# Patient Record
Sex: Female | Born: 1935 | Race: White | Hispanic: No | Marital: Married
Health system: Northeastern US, Academic
[De-identification: ages and names within clinical notes are randomized; demographics above are authoritative.]

## PROBLEM LIST (undated history)

## (undated) ENCOUNTER — Encounter

---

## 2017-05-05 LAB — LIPID PROFILE (EXT)
Cholesterol (EXT): 157 mg/dL (ref 0–199)
HDL Cholesterol (EXT): 53.6 mg/dL (ref >=50.0)
LDL Cholesterol, CALC (EXT): 83 mg/dL (ref 0–100)
Risk Factor (EXT): 2.9 (ref 0.0–4.4)
Triglycerides (EXT): 100 mg/dL (ref 10–200)

## 2017-05-05 LAB — UNMAPPED LAB RESULTS: Creatinine (EXT): 0.73 mg/dL (ref 0.50–1.30)

## 2019-02-23 LAB — LIPID PROFILE (EXT)
Cholesterol (EXT): 128 mg/dL (ref 0–199)
HDL Cholesterol (EXT): 46.4 mg/dL — ABNORMAL LOW (ref >=50.0)
LDL Cholesterol, CALC (EXT): 62 mg/dL (ref 0–100)
Risk Factor (EXT): 2.8 (ref 0.0–4.4)
Triglycerides (EXT): 99 mg/dL (ref 10–200)

## 2020-04-03 LAB — LIPID PROFILE (EXT)
Cholesterol (EXT): 172 mg/dL (ref <200)
HDL Cholesterol (EXT): 49.2 mg/dL — ABNORMAL LOW (ref >=60.0)
LDL Cholesterol, CALC (EXT): 77 mg/dL (ref 0–100)
Risk Factor (EXT): 3.5 (ref 0.0–4.4)
Triglycerides (EXT): 228 mg/dL — ABNORMAL HIGH (ref <150)

## 2021-01-28 LAB — LIPID PROFILE (EXT)
Cholesterol (EXT): 119 mg/dL (ref <200)
HDL Cholesterol (EXT): 32.9 mg/dL — ABNORMAL LOW (ref >=60.0)
LDL Cholesterol, CALC (EXT): 58 mg/dL (ref 0–100)
Risk Factor (EXT): 3.6 (ref 0.0–4.4)
Triglycerides (EXT): 139 mg/dL (ref <150)

## 2021-03-10 ENCOUNTER — Other Ambulatory Visit

## 2021-03-10 ENCOUNTER — Institutional Professional Consult (permissible substitution): Admit: 2021-03-10 | Discharge: 2021-03-10 | Payer: MEDICARE

## 2021-03-10 ENCOUNTER — Encounter

## 2021-03-10 VITALS — BP 132/68 | HR 64 | Ht 62.0 in | Wt 173.4 lb

## 2021-03-10 DIAGNOSIS — R42 Dizziness and giddiness: Secondary | ICD-10-CM

## 2021-03-10 NOTE — Progress Notes (Signed)
 I saw and evaluated the patient, participating in the key portions of the service.  I reviewed the resident?s note.  I agree with the resident?s findings and plan.     Val Riles, MD

## 2021-03-10 NOTE — Progress Notes (Signed)
 [facility] - Neurology  [address]  [place] [address]  [phone]    Brittany Morales is a 87 y.o.-year-old female with a history of HTN, HLD, -ve orthostatic test in previous who presented for the evaluation of disequilibrium issues.    PMSx:  Previous surgery: Decompressive laminectomy in past (first one was 2001)  Has had two fusion operations in past (last one was the L34 fusion in 2010)    Reports since beginning of March she has had some equilibrium problems, has fallen once, does use a cane occasionally. She does have that imbalance all the time and some days it is better than the others. PT does not think it is vestibular related. W/in the last month she reports having some jitters and feeling of it internally as if her nerve endings are jumping. Sometimes feels her hands and legs moving and rarer can see them moving. Usually it feels as if there is something inside her. All of her testing was done in [place], has had MRI of brain in March that came out negative, then also has had ENT test which they did not find anything abnormal and it was thought that it is inner ear problem. She describes disequilibrium as lightheadedness, reports that when she is in an open space she gets difficulty walking and becomes wobbly. Triggers for dizziness are getting up from resting position mainly, she mainly valsalva does not cause the dizziness but blowing her nose does. It recovers right away. She does report wobbliness during walking, she walks slower than before now. She feels very comfortable in the pool. Usually she feels lightheaded constantly. Reports visual decrease due to macular degeneration. No hallucinations.  Reports minimal memory issues but overall her husband and she herself do not complain, she is not as sharp as she used to be, mainly w/ names.   Does not recall shuffling gait, reports to have some tremors in her arms at rest that is better when she tries to reach to something. She does  not know if she feels in her UE and LE. Does not notice anything her emotional state. She has urge incontinence and does not think it is anything worsened. She does not think that her speech is slowed. She feels that when she goes to sleep she feels that her legs are jittery but it does not affect her sleep. No mask like facies. She has had covid around mid february. Her symptoms started after that.     MR brain:  MR of brain fall 2021 with mild microvascular changes, scattered in white matter. Report of March 2022 brain MR suggests no significant changes.    MR L spine Sept 2021 shows L2-3 post interbody fusion with mild bilateral facet arthropathy and with some right neuroforaminal stenosis. Mild spinal canal stenosis; L3/4 PLIF and pedicles screws in L4 and L4 without overt lateral recess, foraminal or canal stenosis. At L45 there is a small disc bulge with Mild lateral recess, foraminal or canal stenosis; less prominent findings at L5S1 than at L45.    They live half of the year in [place], half of it in [facility]    Subjective      Past Medical History:  No past medical history on file.    No past surgical history on file.    Allergies:  Patient has no allergy information on record.    Current Meds:  Current Outpatient Medications   Medication Sig Dispense Refill   ? atenolol (Tenormin) 25 mg  tablet Take 25 mg by mouth in the morning.     ? cefuroxime (Ceftin) 500 mg tablet Take 500 mg by mouth in the morning and at bedtime.     ? fluticasone (Flonase) 50 mcg/actuation nasal spray 1 spray in the morning.     ? hydroCHLOROthiazide (Microzide) 12.5 mg capsule Take 12.5 mg by mouth in the morning.     ? valsartan (Diovan) 80 mg tablet Take 160 mg by mouth in the morning.       No current facility-administered medications for this visit.       Social History:  Social History     Socioeconomic History   ? Marital status: Married     Spouse name: Not on file   ? Number of children: Not on file   ? Years of education: Not on  file   ? Highest education level: Not on file   Occupational History   ? Not on file   Tobacco Use   ? Smoking status: Not on file   ? Smokeless tobacco: Not on file   Substance and Sexual Activity   ? Alcohol use: Not on file   ? Drug use: Not on file   ? Sexual activity: Not on file   Other Topics Concern   ? Not on file   Social History Narrative   ? Not on file     Social Determinants of Health     Financial Resource Strain: Not on file   Food Insecurity: Not on file   Transportation Needs: Not on file   Physical Activity: Not on file   Stress: Not on file   Social Connections: Not on file   Intimate Partner Violence: Not on file   Housing Stability: Not on file       Family History:  No family history on file.      Objective     Last Recorded Vitals  Visit Vitals  BP 132/68   Pulse 64   Ht 1.575 m   Wt 78.7 kg   BMI 31.72 kg/m?   BSA 1.86 m?     Neurologic  Mental Status: Alert, recalls a coherent and detailed history. Speech is fluent with full sentences. Follows midline and appendicular commands. No dysarthria. 3/3, attention intact.  Cranial nerves:   III, IV, VI: Pupils briskly reactive to light and symmetric, 3->62mm bilaterally. EOMI, intact convergence. Smooth saccades with no pathologic nystagmus.   V: Sensation intact in V1 to V3 face.   VII: No facial droop, symmetric rise of eye brows, symmetric face at rest and with smile.   VIII: Hearing is normal on conversation.   IX, X: Uvula midline; palate elevates symmetrically.   XI: Shoulder shrug intact.   XII: Able to point tongue straight and side to side. No fasciculations observed.   Motor: Normal tone.   R/L:  Deltoid 5/5, Biceps 5/5, Triceps 5/5, Wrist extension 5/5, Finger extension 5/5, Finger flexion 5/5,   Hip flexion 5/5, Knee flexion 5/5, Knee extension 5/5, Ankle dorsiflexion 5/5, Ankle Plantarflexion 5/5,   Reflexes: Biceps 2+/2+, Triceps 2+/2+, Brachioradialis 2+/2+. Patellar unable to get and achilles 0.  Sensory: Light touch normal in upper  and lower extremities. Romberg +ve  Coordination: No dysmetria on finger-to-nose. No resting tremor but slowed rapid movements worse on the left compared to the right  Gait/Stance: Gait is unsteady, scooped front shoulders, decreased arm sway on left more than right, en bloc turning    Assessment and Plans:  Assessment/Plan    36 F coming in for possible bradykinesia that is asymmetric worse on left than on the right, en bloc turning w/ concern for possible not fully specified at the moment Parkinsonian symptoms. Recommendation would be to start w/ dat scan before initiating any medications. MRI no images on file but would request them from [facility].    Recommendations:  dat scan to eval for possible parkinsonism  Can consider compression stockings for possible mild orthostatic hypotension/autonomic symptoms  cw b12 replacement as ordered by primary care  RTC in 6 weeks after scan is done  Encourage oral hydration  cw PT as before    Dorena Dew, MD   Department of Neurology   [facility]

## 2021-03-11 ENCOUNTER — Encounter (HOSPITAL_BASED_OUTPATIENT_CLINIC_OR_DEPARTMENT_OTHER)

## 2021-04-04 ENCOUNTER — Other Ambulatory Visit

## 2021-04-05 ENCOUNTER — Other Ambulatory Visit

## 2021-04-06 ENCOUNTER — Other Ambulatory Visit

## 2021-04-06 ENCOUNTER — Inpatient Hospital Stay: Admit: 2021-04-06 | Payer: MEDICARE

## 2021-04-06 DIAGNOSIS — R42 Dizziness and giddiness: Secondary | ICD-10-CM

## 2021-04-06 MED ORDER — potassium iodide (Expectorant) (SSKI) solution 450 mg
1 | Freq: Once | ORAL | Status: AC
Start: 2021-04-06 — End: 2021-04-06
  Administered 2021-04-06: 14:00:00 450 mg via ORAL

## 2021-04-06 MED ORDER — ioflupane (I-123) (DaTscan) radio-isotope injection 5.1 millicurie
5 | Freq: Once | INTRAVENOUS | Status: AC
Start: 2021-04-06 — End: 2021-04-06
  Administered 2021-04-06: 16:00:00 5.1 via INTRAVENOUS

## 2021-04-19 ENCOUNTER — Other Ambulatory Visit

## 2021-04-20 ENCOUNTER — Ambulatory Visit: Admit: 2021-04-20 | Discharge: 2021-04-20 | Payer: MEDICARE

## 2021-04-20 VITALS — BP 159/65 | HR 56 | Ht 62.01 in | Wt 176.4 lb

## 2021-04-20 DIAGNOSIS — R251 Tremor, unspecified: Secondary | ICD-10-CM

## 2021-04-20 MED ORDER — carbidopa-levodopa (Sinemet) 25-100 mg tablet
25-100 | ORAL_TABLET | Freq: Three times a day (TID) | ORAL | 5 refills | 17.00000 days | Status: AC
Start: 2021-04-20 — End: 2021-11-01

## 2021-04-20 MED ORDER — carbidopa-levodopa (Sinemet) 25-100 mg tablet
25-100 | ORAL_TABLET | Freq: Three times a day (TID) | ORAL | 0 refills | 17.00000 days | Status: AC
Start: 2021-04-20 — End: 2021-05-04

## 2021-04-20 NOTE — Progress Notes (Signed)
 [facility] - Neurology  [address]  [place] [address]  [phone]      Brittany Morales is a 87 y.o.-year-old female with a history of HTN, HLD, -ve orthostatic test in previous who presented for the evaluation of disequilibrium issues.     PMSx:  Previous surgery: Decompressive laminectomy in past (first one was 2001)  Has had two fusion operations in past (last one was the L34 fusion in 2010)     Reports since beginning of March she has had some equilibrium problems, has fallen once, does use a cane occasionally. She does have that imbalance all the time and some days it is better than the others. PT does not think it is vestibular related. W/in the last month she reports having some jitters and feeling of it internally as if her nerve endings are jumping. Sometimes feels her hands and legs moving and rarer can see them moving. Usually it feels as if there is something inside her. All of her testing was done in [place], has had MRI of brain in March that came out negative, then also has had ENT test which they did not find anything abnormal and it was thought that it is inner ear problem. She describes disequilibrium as lightheadedness, reports that when she is in an open space she gets difficulty walking and becomes wobbly. Triggers for dizziness are getting up from resting position mainly, she mainly valsalva does not cause the dizziness but blowing her nose does. It recovers right away. She does report wobbliness during walking, she walks slower than before now. She feels very comfortable in the pool. Usually she feels lightheaded constantly. Reports visual decrease due to macular degeneration. No hallucinations.  Reports minimal memory issues but overall her husband and she herself do not complain, she is not as sharp as she used to be, mainly w/ names.   Does not recall shuffling gait, reports to have some tremors in her arms at rest that is better when she tries to reach to something. She  does not know if she feels in her UE and LE. Does not notice anything her emotional state. She has urge incontinence and does not think it is anything worsened. She does not think that her speech is slowed. She feels that when she goes to sleep she feels that her legs are jittery but it does not affect her sleep. No mask like facies. She has had covid around mid february. Her symptoms started after that.      MR brain:  MR of brain fall 2021 with mild microvascular changes, scattered in white matter. Report of March 2022 brain MR suggests no significant changes.    MR L spine Sept 2021 shows L2-3 post interbody fusion with mild bilateral facet arthropathy and with some right neuroforaminal stenosis. Mild spinal canal stenosis; L3/4 PLIF and pedicles screws in L4 and L4 without overt lateral recess, foraminal or canal stenosis. At L45 there is a small disc bulge with Mild lateral recess, foraminal or canal stenosis; less prominent findings at L5S1 than at L45.     They live half of the year in [place], half of it in [facility]    Interval history:  For 2 days this past week has been feeling completely normal. Did not have any jitters, she is encouraged by feeling better. Today she has been feeling very wobbly. Has been feeling sensation of almost ginger ale/feeling of movement going on and she does not see it but feels it.  Overall feels stable.     Subjective      Past Medical History:  No past medical history on file.    No past surgical history on file.    Allergies:  Patient has no allergy information on record.    Current Meds:  Current Outpatient Medications   Medication Sig Dispense Refill    atenolol (Tenormin) 25 mg tablet Take 25 mg by mouth in the morning.      fluticasone (Flonase) 50 mcg/actuation nasal spray 1 spray in the morning.      valsartan (Diovan) 80 mg tablet Take 160 mg by mouth in the morning.      cefuroxime (Ceftin) 500 mg tablet Take 500 mg by mouth in the morning and at bedtime.       hydroCHLOROthiazide (Microzide) 12.5 mg capsule Take 12.5 mg by mouth in the morning.       No current facility-administered medications for this visit.       Social History:  Social History     Socioeconomic History    Marital status: Married     Spouse name: Not on file    Number of children: Not on file    Years of education: Not on file    Highest education level: Not on file   Occupational History    Not on file   Tobacco Use    Smoking status: Not on file    Smokeless tobacco: Not on file   Substance and Sexual Activity    Alcohol use: Not on file    Drug use: Not on file    Sexual activity: Not on file   Other Topics Concern    Not on file   Social History Narrative    Not on file     Social Determinants of Health     Financial Resource Strain: Not on file   Food Insecurity: Not on file   Transportation Needs: Not on file   Physical Activity: Not on file   Stress: Not on file   Social Connections: Not on file   Intimate Partner Violence: Not on file   Housing Stability: Not on file       Family History:  No family history on file.      Objective     Last Recorded Vitals  Visit Vitals  BP (!) 159/65 (BP Location: Left arm, Patient Position: Sitting)   Pulse 56   Ht 1.575 m   Wt 80 kg   BMI 32.26 kg/m   BSA 1.87 m       Mental Status: Alert, recalls a coherent and detailed history. Speech is fluent with full sentences. Follows midline and appendicular commands. No dysarthria. 3/3, attention intact.  Cranial nerves:   III, IV, VI: Pupils briskly reactive to light and symmetric, 3->72mm bilaterally. EOMI, intact convergence. Smooth saccades with no pathologic nystagmus.   V: Sensation intact in V1 to V3 face.   VII: No facial droop, symmetric rise of eye brows, symmetric face at rest and with smile.   VIII: Hearing is normal on conversation.   IX, X: Uvula midline; palate elevates symmetrically.   XI: Shoulder shrug intact.   XII: Able to point tongue straight and side to side. No fasciculations observed.    Motor: Normal tone.   R/L:  Deltoid 5/5, Biceps 5/5, Triceps 5/5, Wrist extension 5/5, Finger extension 5/5, Finger flexion 5/5,   Hip flexion 5/5, Knee flexion 5/5, Knee extension 5/5, Ankle dorsiflexion 5/5, Ankle Plantarflexion 5/5,  Reflexes: Biceps 2+/2+, Triceps 2+/2+, Brachioradialis 2+/2+. No clonus. Patellar unable to get and achilles 0.  Sensory: Light touch and cold touch normal in upper and lower extremities. Romberg +ve  Coordination: No dysmetria on finger-to-nose. No resting tremor but slowed rapid movements worse on the left compared to the right  Gait/Stance: Gait is unsteady, scooped front shoulders, decreased arm sway on left more than right, en bloc turning    Assessment and Plans:   Assessment/Plan     87 year old woman coming in for possible bradykinesia that is asymmetric; worse on left than on the right with en bloc turning. There is concern for possible though not fully specified Parkinsonian symptoms. Likelihood of parkinson on lower side right now, and, to complete her work, will order MRI C spine to evaluate for other causes of gait difficulty such as cervical myelopathy. Will also start patient on antiparkinson med such as sinemet as trial. MRI request from [facility]. DaT scan unremarkable.    Recommendations:  Taper of sinemet half tab for 2 weeks, 25/100, then start on sinemet 1 tab 25/100  MRI c spine to eval for possible cause of dizziness  Compression stockings for possible mild orthostatic hypotension/autonomic symptoms  cw b12 replacement as ordered by primary care  RTC in 6 weeks after MR C spine is done  Encourage oral hydration  cw PT as before  Request brain imaging pcitures from [facility] in [place]    Answers for HPI/ROS submitted by the patient on 04/19/2021  altered mental status: No  clumsiness: Yes  focal sensory loss: No  focal weakness: No  loss of balance: Yes  memory loss: No  near-syncope: No  slurred speech: No  syncope: No  visual change: No  weakness: No  Chronicity:  chronic  Onset: more than 1 month ago  Onset quality: gradually  Progression since onset: unchanged  Focality: no focality noted  abdominal pain: No  auditory change: No  aura: No  back pain: No  bladder incontinence: No  bowel incontinence: No  chest pain: No  confusion: No  diaphoresis: No  dizziness: No  fatigue: Yes  fever: No  headaches: No  light-headedness: Yes  nausea: No  neck pain: No  palpitations: No  shortness of breath: No  vertigo: No  vomiting: No  Treatments tried: medication  Improvement on treatment: no relief    Dorena Dew, MD   Department of Neurology   [facility]

## 2021-04-20 NOTE — Progress Notes (Signed)
 I saw and evaluated the patient, participating in the key portions of the service.  I reviewed the resident's note and I agree with findings and plan.

## 2022-11-28 IMAGING — MR MRI BRAIN WITHOUT CONTRAST
8 of 12 series · 33 of 48 positions shown · IV contrast (gadolinium)
Comparison: None

________________________________________________________________________________________________ 
MRI BRAIN WITHOUT CONTRAST, 11/28/2022 [DATE]: 
CLINICAL INDICATION: Dizziness. Equilibrium and balance issues. Fall [DATE] years 
ago with trauma to head. Prior lumbar fusion.
TECHNIQUE: Multiplanar, multiecho position MR images of the brain were performed 
without intravenous gadolinium enhancement. Patient was scanned on a 3T magnet.

[Series 101: survey · axial · 10.0mm · 0.98mm/px · 1 of 5 slices shown]
[im 1/5]
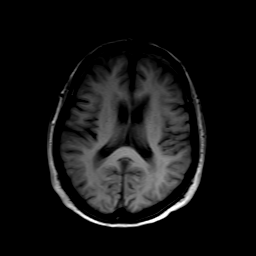

[Series 203: dadc map · axial · 4.0mm · 1.07mm/px · z∈[-99,+45]mm · 3 of 30 slices shown (1 of 2)]
[im 1/30]
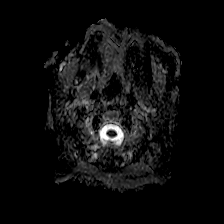
[im 15/30]
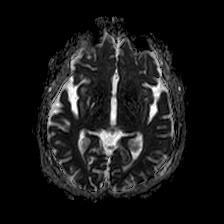
[im 30/30]
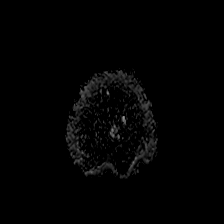

[Series 204: isob (id) · axial · 4.0mm · 1.07mm/px · z∈[-99,+45]mm · 3 of 30 slices shown (1 of 2)]
[im 1/30]
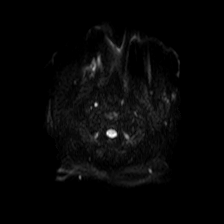
[im 15/30]
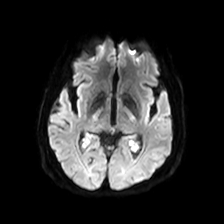
[im 30/30]
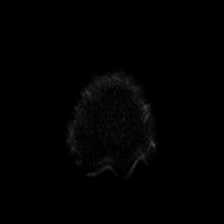

[Series 303: dadc map · coronal · 4.0mm · 0.81mm/px · 3 of 33 slices shown (2 of 2)]
[im 1/33]
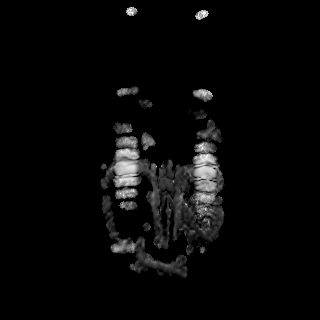
[im 17/33]
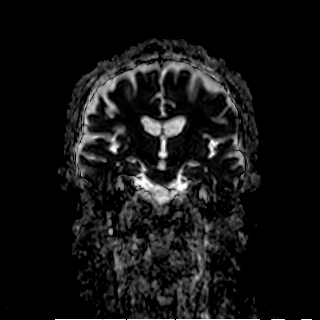
[im 33/33]
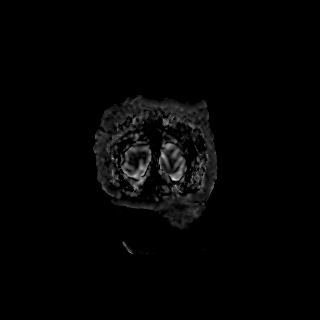

[Series 304: isob (id) · coronal · 4.0mm · 0.81mm/px · 1 of 33 slices shown (2 of 2)]
[im 1/33]
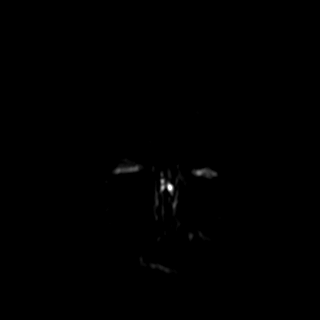

[Series 501: FLAIR fat-sat · axial · 5.0mm · 0.60mm/px · z∈[-114,+39]mm · 3 of 27 slices shown]
[im 1/27]
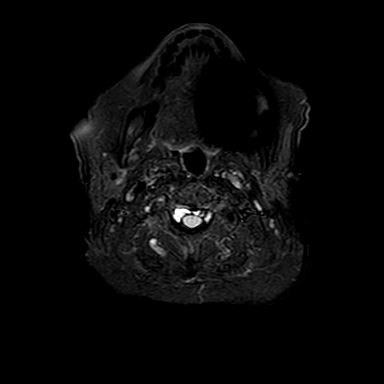
[im 14/27]
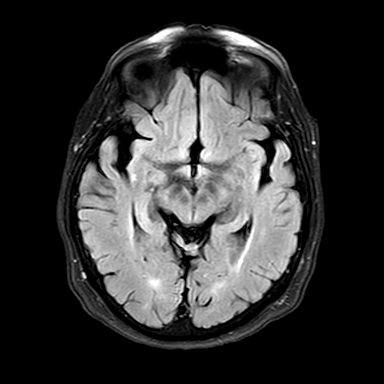
[im 27/27]
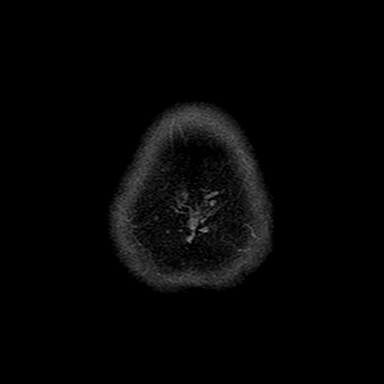

[Series 601: SWI · axial · 3.0mm · 0.53mm/px · z∈[-112,+33]mm · 11 of 100 slices shown (1 of 2)]
[im 1/100]
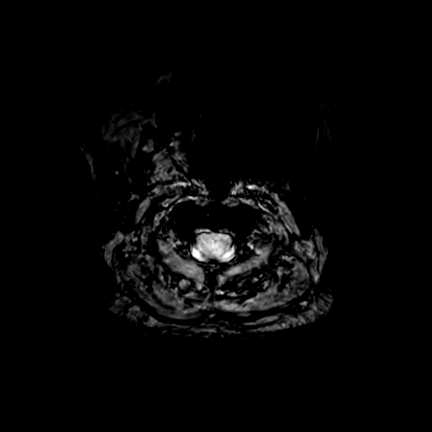
[im 10/100]
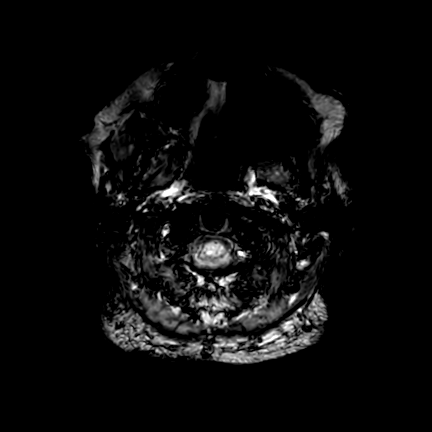
[im 20/100]
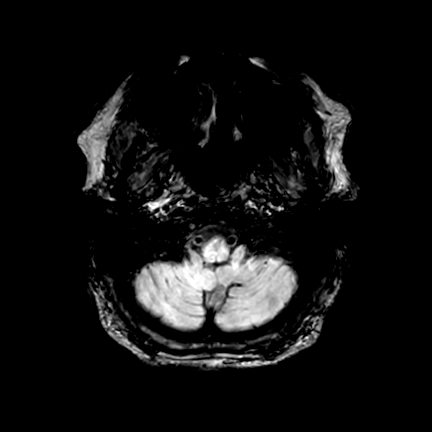
[im 30/100]
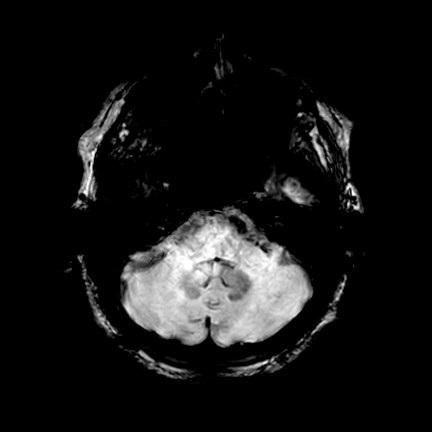
[im 40/100]
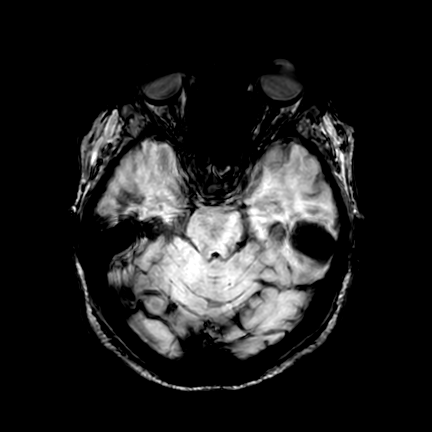
[im 50/100]
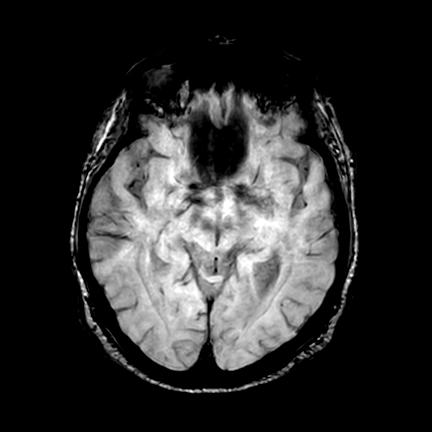
[im 60/100]
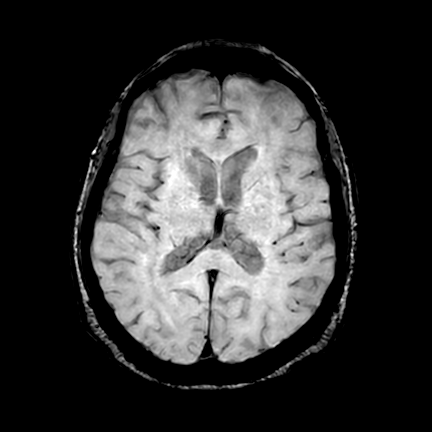
[im 70/100]
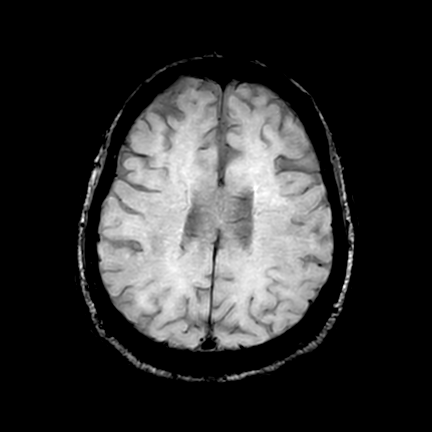
[im 80/100]
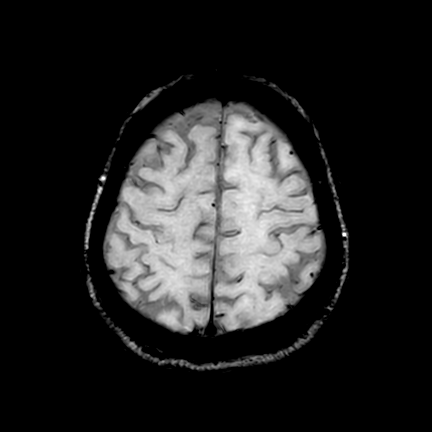
[im 90/100]
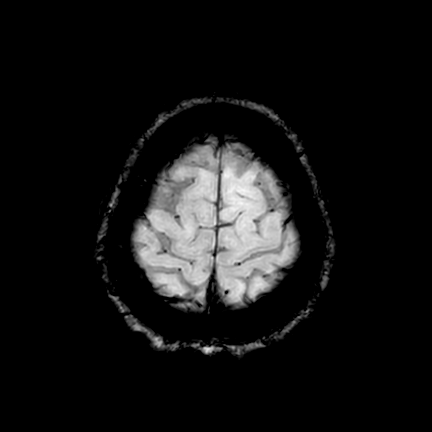
[im 100/100]
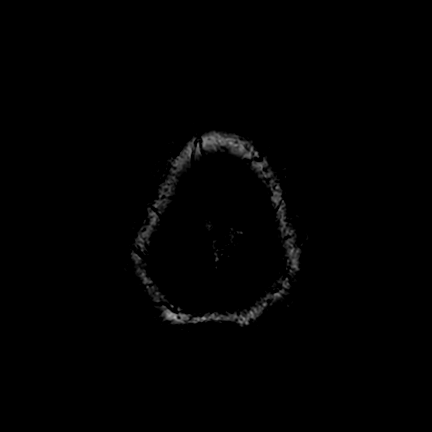

[Series 602: SWI · axial · 10.0mm · 0.53mm/px · z∈[-113,+38]mm · 8 of 78 slices shown (2 of 2)]
[im 1/78]
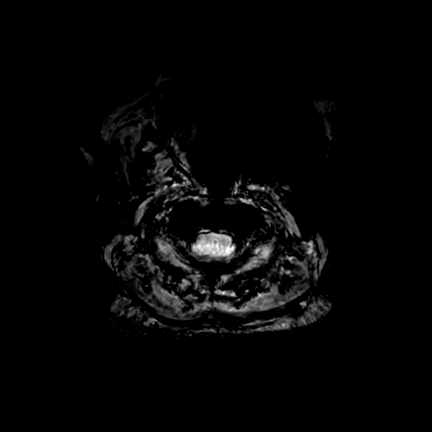
[im 12/78]
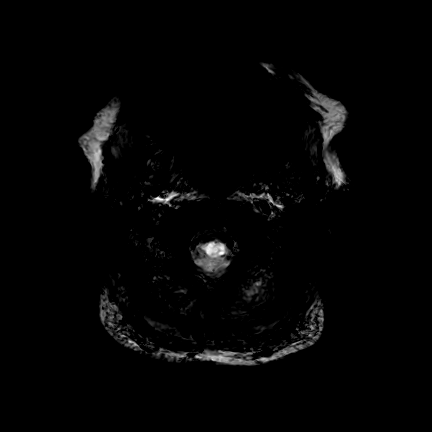
[im 23/78]
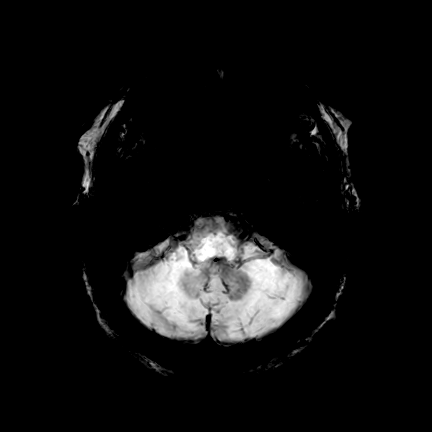
[im 34/78]
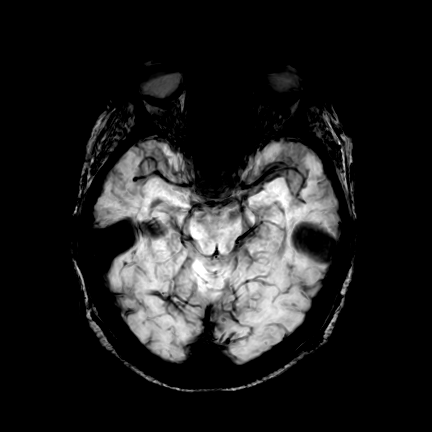
[im 45/78]
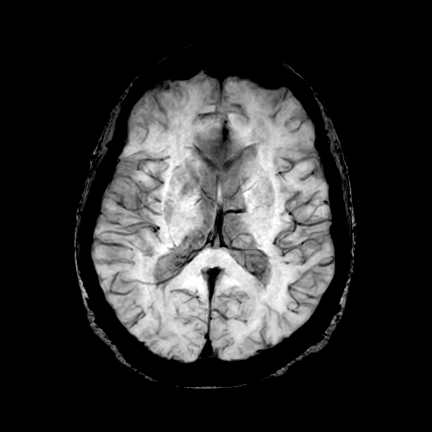
[im 56/78]
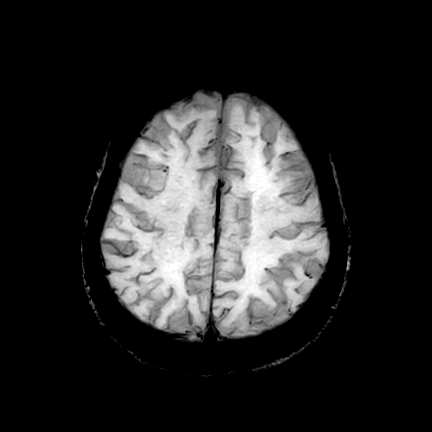
[im 67/78]
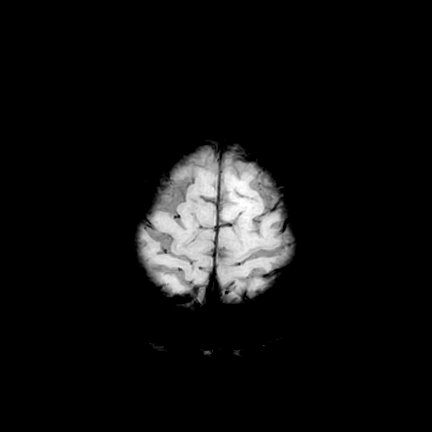
[im 78/78]
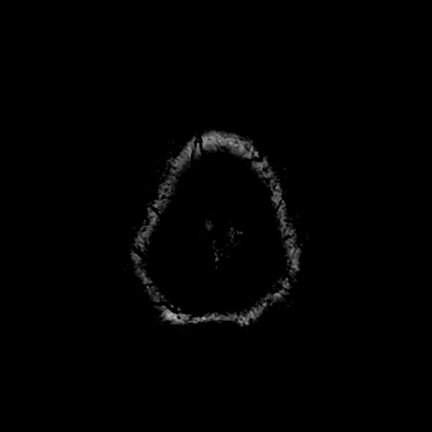

[33 of 48 positions shown; findings below may reference images not displayed]

FINDINGS: -------------------------------------------------------------------------------- 
------------------------- 
INTRACRANIAL: 
Mild degree of nonspecific periventricular, deep white matter, and subcortical 
T2 FLAIR hyperintensity is most likely reflective of chronic small vessel 
vascular change. No mass or abnormal extra-axial fluid collection. Similar 
findings in the subinsular regions bilaterally. Old slitlike subcentimeter right 
subinsular lacunar infarct versus prominent perivascular space. 
No acute ischemia. No abnormal foci of susceptibility artifact in the brain. 
Patency of the intracranial vascular flow voids.  No acute intracranial 
hemorrhage, mass effect, midline shift. No large sellar mass. No hydrocephalus. 
Cerebral volume is age appropriate.  
-------------------------------------------------------------------------------- 
----------------------- 
OTHER: 
ORBITS/SINUSES/T-BONES:  Bilateral pseudophakia.  Mastoid air cells and middle 
ear cavities are grossly clear.  Visualized paranasal sinuses are clear. 
MARROW SIGNAL/SOFT TISSUES: No focal suspect signal abnormality.  Suggestion for 
mild canal stenosis C3-C4 with ventral cord impression. 
-------------------------------------------------------------------------------- 
-------------------
IMPRESSION: No acute intracranial process. Chronic appearing brain parenchymal changes as 
detailed above. No mass or mass effect. 
Possible mild canal stenosis C3-C4 with ventral cord indentation. Consider 
dedicated MRI cervical spine.

## 2023-01-01 IMAGING — MR MRI CERVICAL SPINE WITHOUT CONTRAST
5 of 7 series · 23 of 48 positions shown · IV contrast (gadolinium)
Comparison: Brain MRI November 28, 2022.

________________________________________________________________________________________________ 
MRI CERVICAL SPINE WITHOUT CONTRAST, 01/01/2023 [DATE]: 
CLINICAL INDICATION: Neck pain with limited range of motion. Numbness in the 
hands bilaterally.
TECHNIQUE: Multiplanar, multiecho position MR images of the cervical spine were 
performed without intravenous gadolinium enhancement. Patient was scanned on a 3 
[employer] magnet.

[Series 101: survey · axial · 10.0mm · 0.94mm/px · z∈[-15,+150]mm · 4 of 9 slices shown]
[im 1/9]
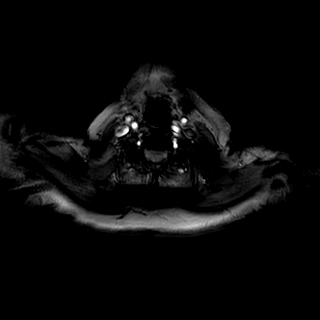
[im 3/9]
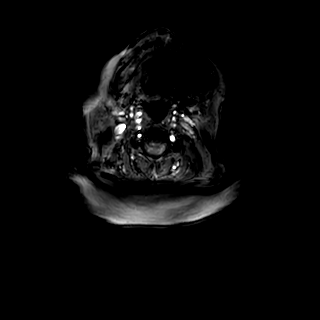
[im 6/9]
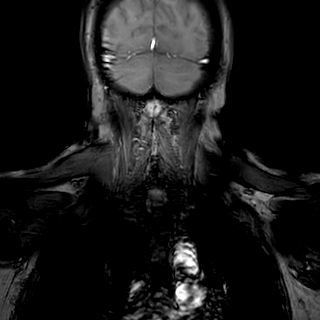
[im 9/9]
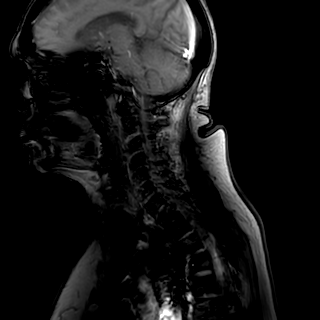

[Series 201: t2w_tse cor · coronal · 5.0mm · 0.49mm/px · 3 of 7 slices shown]
[im 1/7]
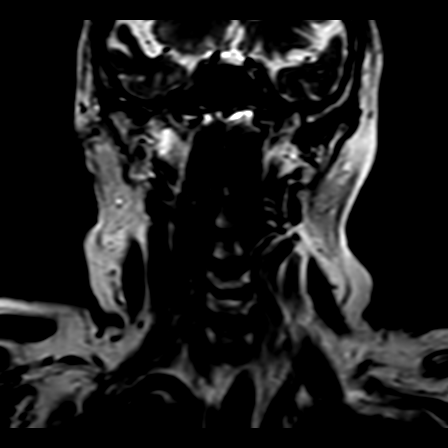
[im 4/7]
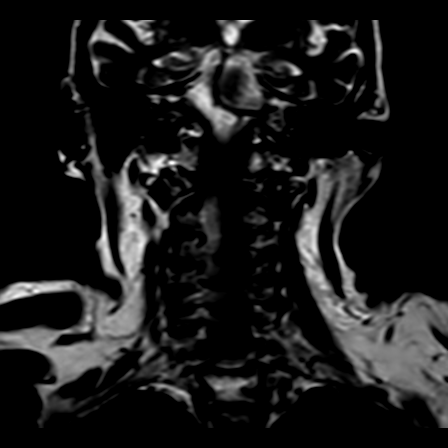
[im 7/7]
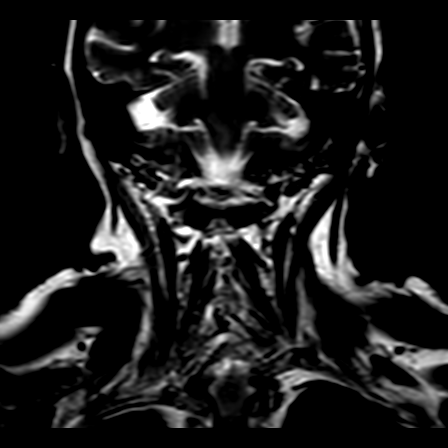

[Series 301: t1w_tse sag · sagittal · 3.0mm · 0.46mm/px · 6 of 15 slices shown]
[im 1/15]
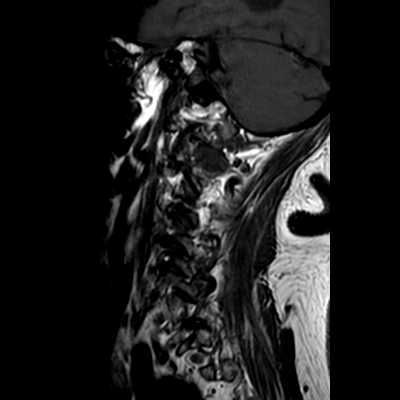
[im 3/15]
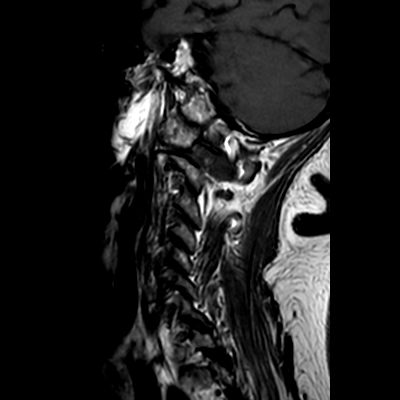
[im 6/15]
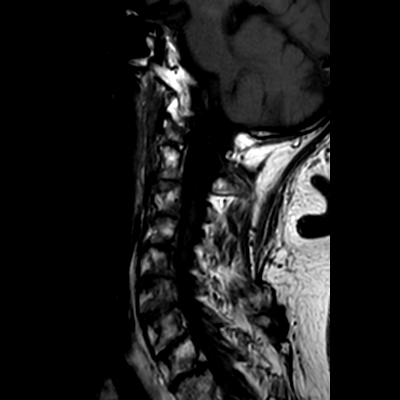
[im 9/15]
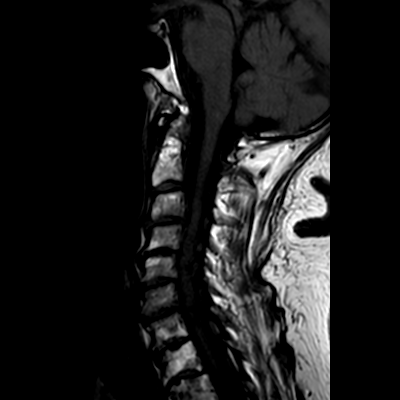
[im 12/15]
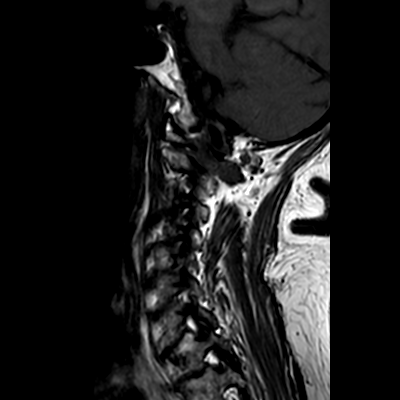
[im 15/15]
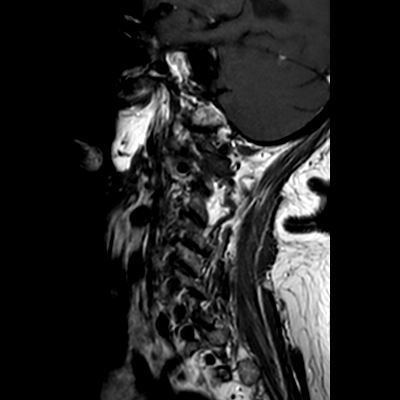

[Series 402: st2w_tse sag fs · sagittal · 3.0mm · 0.38mm/px · 6 of 15 slices shown]
[im 1/15]
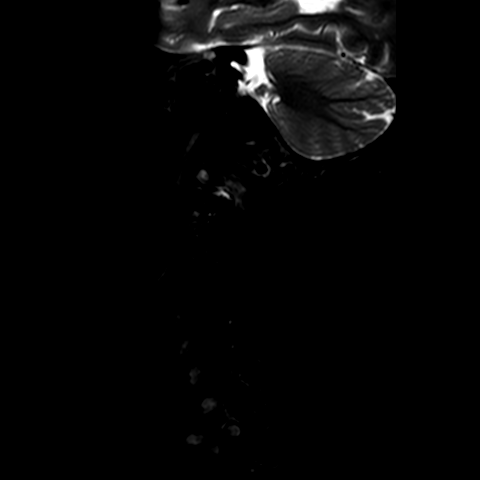
[im 3/15]
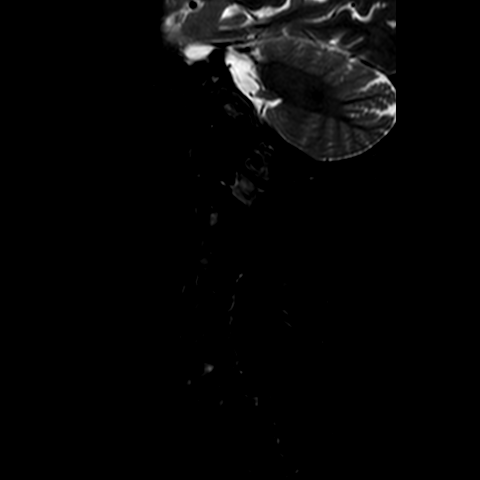
[im 6/15]
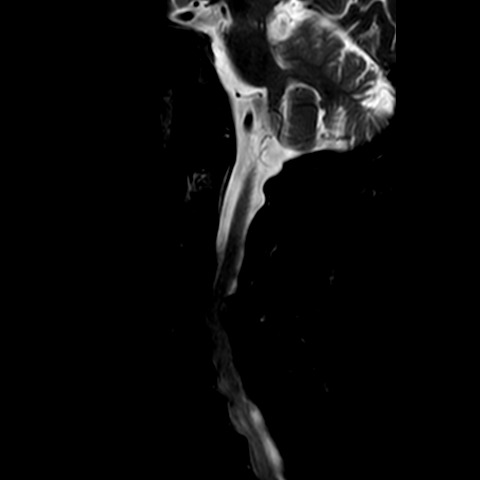
[im 9/15]
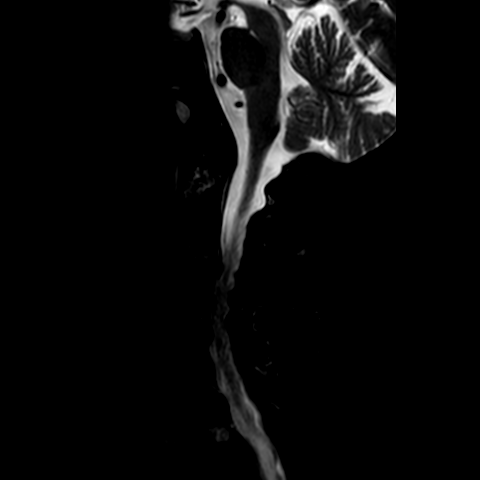
[im 12/15]
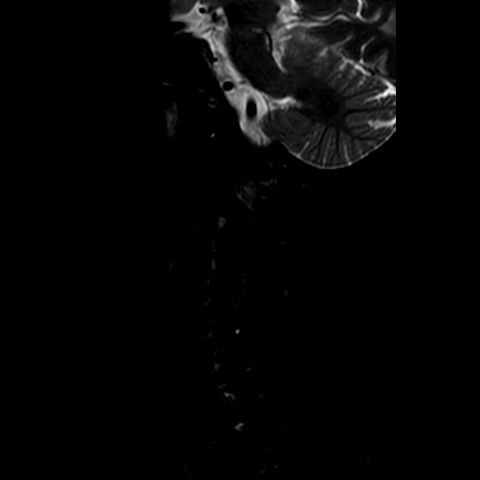
[im 15/15]
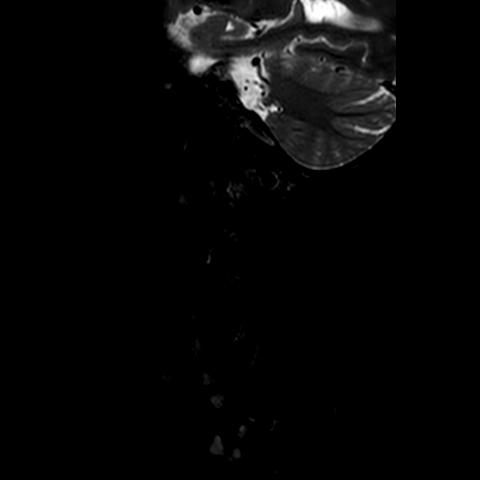

[Series 403: st2w_tse sag · sagittal · 3.0mm · 0.38mm/px · 4 of 15 slices shown]
[im 1/15]
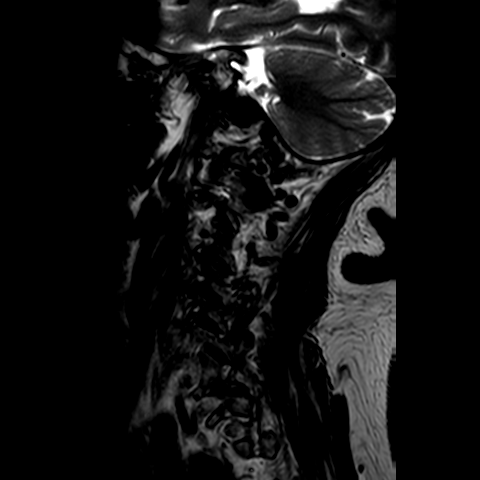
[im 3/15]
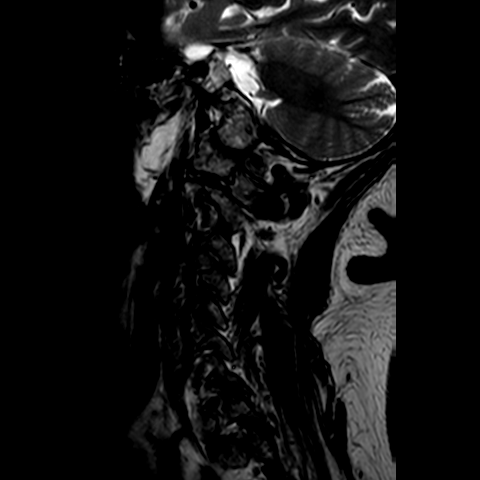
[im 6/15]
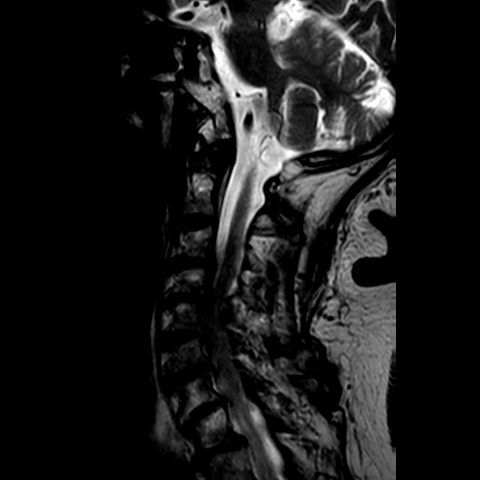
[im 9/15]
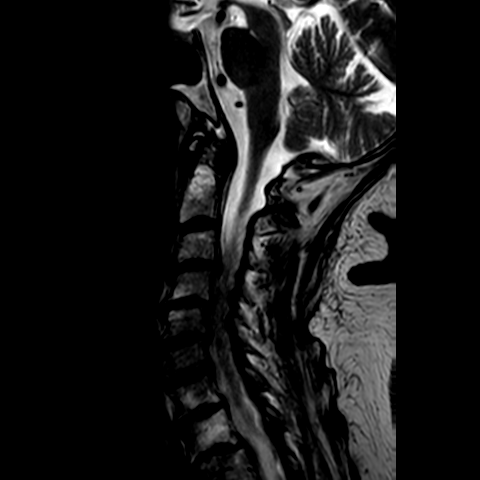

[23 of 48 positions shown; findings below may reference images not displayed]

FINDINGS: -------------------------------------------------------------------------------- 
----------------- 
GENERAL: 
ALIGNMENT: Normal coronal alignment. There is trace grade 1 retrolisthesis C4 on 
C5 and C5 on C6. 
VERTEBRAL BODY HEIGHT: Normal.  
MARROW SIGNAL: There is a hyperintense T1 and T2 lesion within the dens, 
measuring 10 mm AP by 16 mm transverse by 13 mm craniocaudal. This could reflect 
an somewhat atypical appearing hemangioma, but is ultimately indeterminate. 
Additionally, there is a T1 hypointense mildly T2 hyperintense lesion within the 
left aspect of the T1 vertebral body, measuring 10 mm AP by 8 mm transverse by 9 
mm craniocaudal. Similar although smaller lesion is present within the left 
aspect of the C7 vertebral body. These are ultimately indeterminate. No other 
evidence of a marrow infiltrative process. 
CORD SIGNAL: Normal.  
ADDITIONAL FINDINGS: None. 
-------------------------------------------------------------------------------- 
---------------- 
SEGMENTAL: 
CRANIOCERVICAL JUNCTION: No significant stenosis. 
C2-C3: Normal disc height. No herniation. Normal facets. No spinal canal or 
neural foraminal stenosis. 
C3-C4: Mild loss of disc height. Central disc osteophyte extrusion is present. 
It abuts, indents, and distorts the ventral cord margin. Extrusion measures
mm AP dimension by 7 mm craniocaudal dimension. Moderate canal stenosis. Patent 
right foramen. Mild left foraminal narrowing. Mild bilateral uncinate spurring. 
Mild facet arthropathy. 
C4-C5: Mild loss of disc height. Posterior ligamentous hypertrophy. Moderate 
canal stenosis from disc osteophyte complex with flattening of the ventral cord 
margin. Moderate to severe right and mild/moderate left foraminal narrowing. 
Bilateral facet arthropathy. 
C5-C6: Moderate loss of disc height. Posterior ligamentous hypertrophy. Mild to 
moderate canal stenosis with ventral cord flattening. Severe left and moderate 
right foraminal narrowing. Facet arthropathy. 
C6-C7: Mild to moderate loss of disc height. Canal patent. Foramina patent 
despite mild bilateral uncinate spurring. Mild facet arthropathy. 
C7-T1: Mild loss of disc height. Canal patent. Mild to moderate bilateral 
foraminal narrowing with bilateral uncinate spurring and facet arthropathy. 
Visualized upper thoracic segments are without significant canal or foraminal 
stenosis. 
-------------------------------------------------------------------------------- 
---------------
IMPRESSION: Lesion in th dens is ultimately indeterminate but may reflect a somewhat 
atypical appearing hemangioma. Obviously, with trauma, this could predispose to 
pathologic fracture, although there is no evidence of acute osseous pathology. 
There are other, ultimately indeterminate lesions in the C7 and T1 vertebral 
bodies. Consider dedicated CT cervical spine to further evaluate the osseous 
architecture of these lesions. 
Cervical spondylosis with most significant canal stenosis at C3-C4 and C4-C5. 
C3-C4, disc osteophyte extrusion creates ventral cord distortion. 
Multilevel foraminal narrowing detailed above. 
Findings will be phoned to the referring clinicians office. 
Exam was reviewed and read in conjunction with Dr. Nederland.

## 2023-08-14 IMAGING — CT CT CERVICAL SPINE WITHOUT CONTRAST
3 of 4 series · 10 of 33 positions shown, 12 images · non-contrast
Comparison: MR exam of 01/01/2023.

________________________________________________________________________________________________ 
CT CERVICAL SPINE WITHOUT CONTRAST, 08/14/2023 [DATE]: 
CLINICAL INDICATION: Posterior neck pain for several years. Pain is worse when 
walking. Abnormal prior MR exam for 
. 
A search for DICOM formatted images was conducted for prior CT imaging studies 
completed at a non-affiliated media free facility.
TECHNIQUE: The cervical spine was scanned from the skull base through T1 
vertebra without contrast on a high-resolution CT scanner using dose reduction 
techniques. Routine MPR reconstructions were performed.

[Series 3: axial · axial · 0.24mm/px · z∈[-184,-124]mm · 2 of 91 slices shown, 3 images]
[im 31/91  soft-tissue]
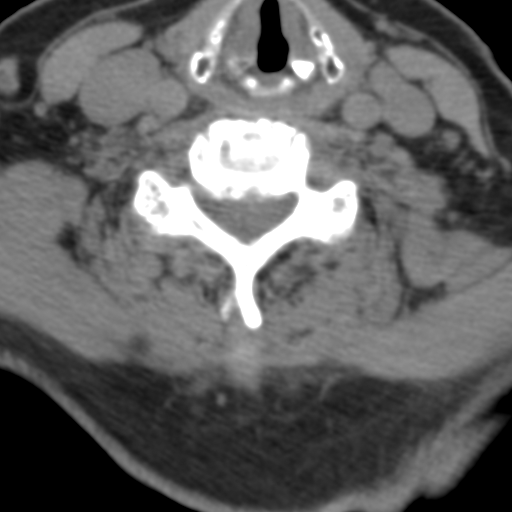
[im 31/91  bone]
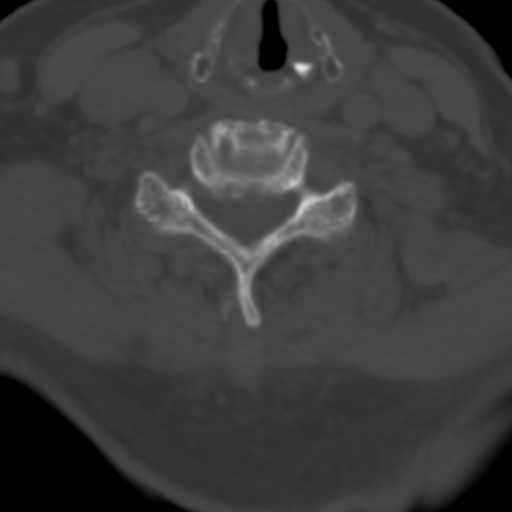
[im 61/91  bone]
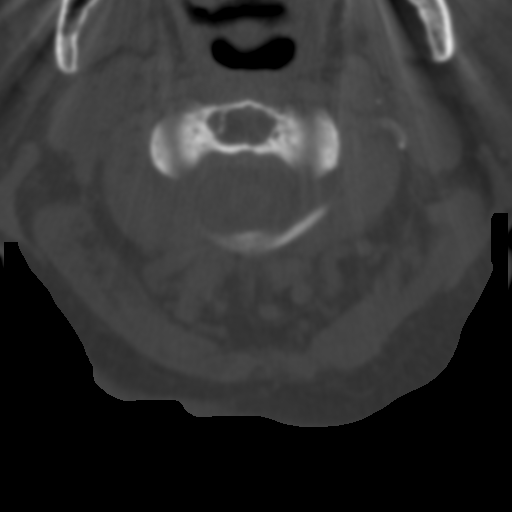

[Series 5: cor · coronal · 0.22mm/px · 3 of 61 slices shown]
[im 13/61  bone]
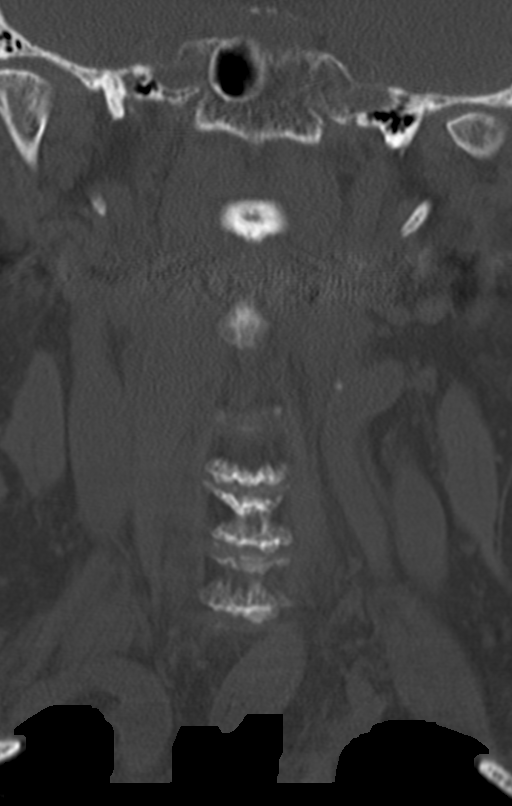
[im 25/61  bone]
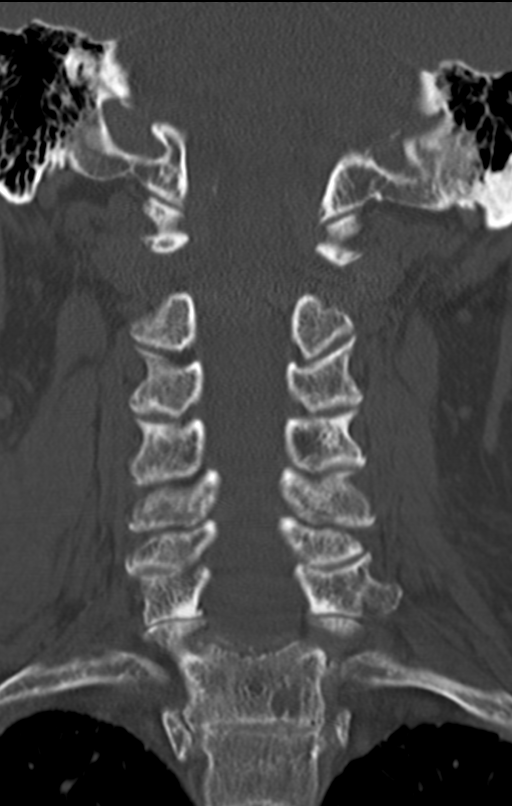
[im 37/61  bone]
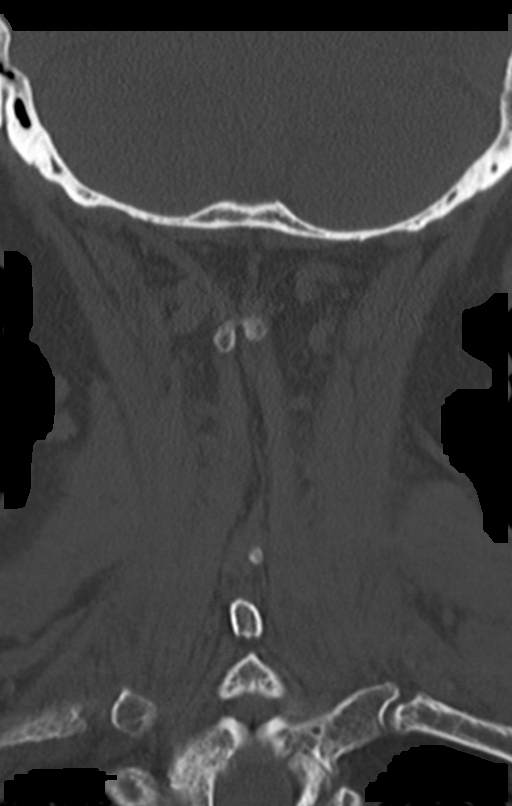

[Series 7: sag st · sagittal · 0.22mm/px · 5 of 61 slices shown, 6 images]
[im 21/61  bone]
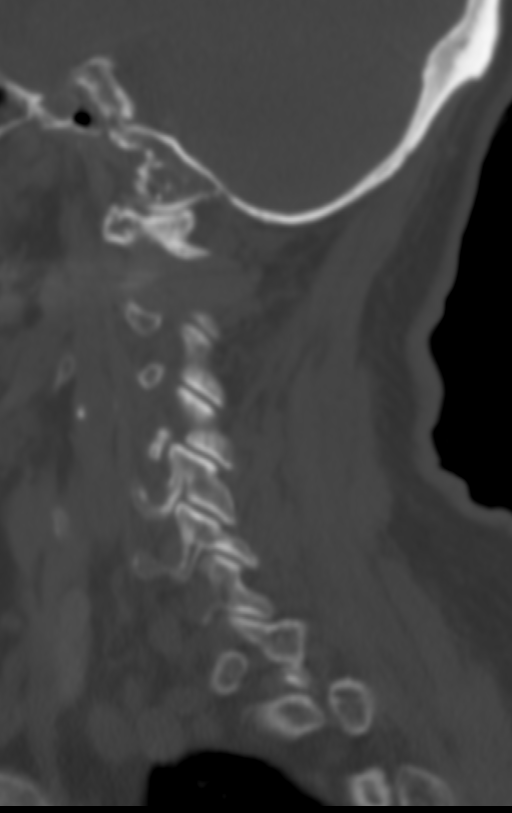
[im 26/61  bone]
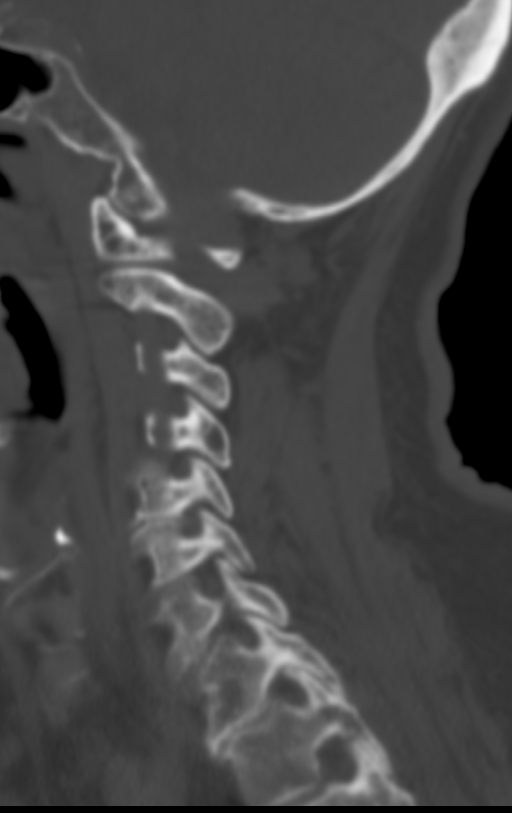
[im 31/61  soft-tissue]
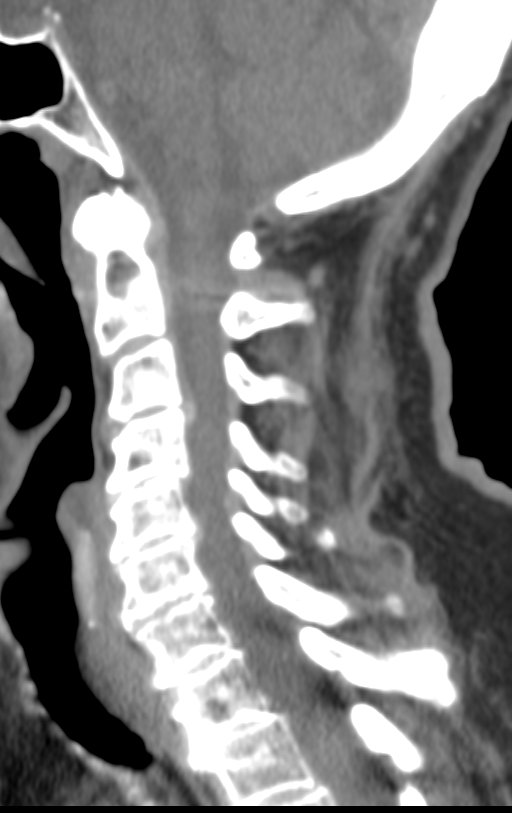
[im 31/61  bone]
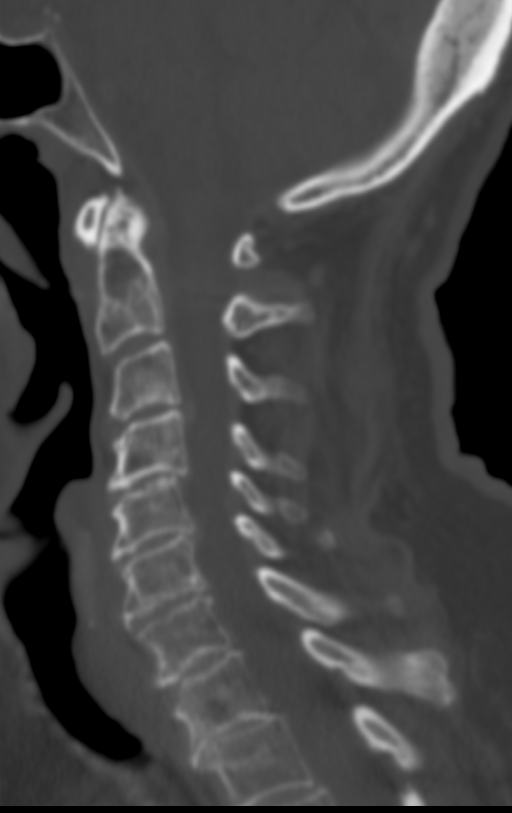
[im 36/61  bone]
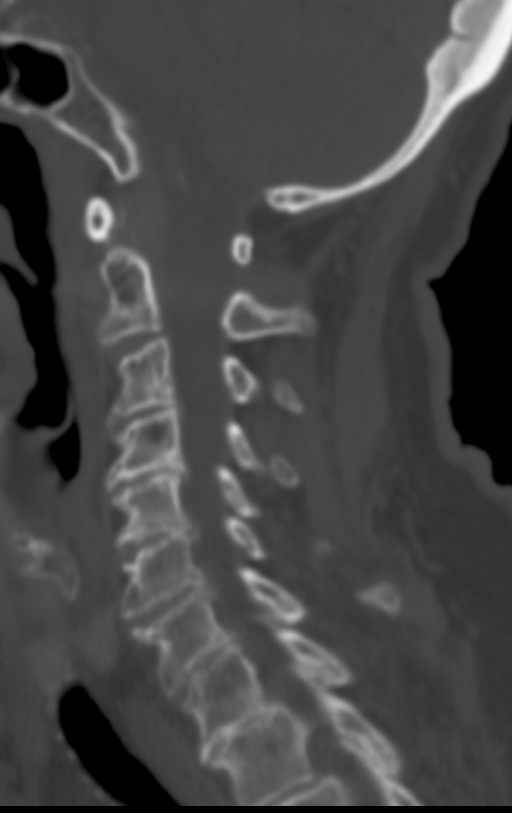
[im 41/61  bone]
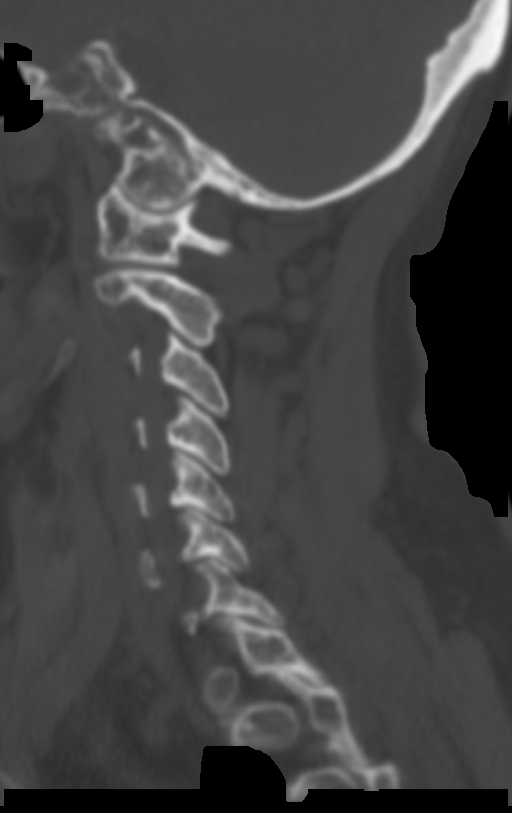

[10 of 33 positions shown; findings below may reference images not displayed]

Count of known CT and Cardiac Nuclear Medicine studies performed in the previous 
12 months = 0.
FINDINGS: -------------------------------------------------------------------------------- 
------ 
GENERAL: 
There is straightening of the overall normal cervical lordotic curvature. Mild 
retrolisthesis C4 on C5 and C5 on C6. Scattered osteophytic spurring. No 
vertebral body fracture. Included intracranial contents are negative. Paraspinal 
musculature is symmetric. Lung apices are clear. 
There is marrow signal change on the prior MR involving the dens, C7 and T1. 
There is cystic change within the mid dense, likely subcortical cystic change 
from the degenerative changes of the anterior C1-C2 articulation. There are 
hemangiomas within the left side of C7 and T1 
-------------------------------------------------------------------------------- 
------     
SEGMENTAL: 
Disc levels are grossly as follows given the intrinsic resolution of the CT 
exam. 
C2-C3: Normal disc height. No herniation. Normal facets. No spinal canal or 
neural foraminal stenosis. 
C3-C4: There is disc height loss. There is a mineralized posterior paracentral 
disc protrusion with moderate canal stenosis. There is uncovertebral and facet 
hypertrophy with moderate left neural foraminal stenosis. 
C4-C5: Moderate disc height loss. Moderate spinal canal stenosis. There is 
uncovertebral and facet hypertrophy with moderate bilateral neural foraminal 
stenoses.  
C5-C6: Moderate disc height loss. Mild to moderate spinal canal stenosis. There 
is uncovertebral and facet hypertrophy with mild left greater the right neural 
foraminal stenoses 
C6-C7: Moderate disc height loss. There is uncovertebral and facet hypertrophy. 
No spinal canal or neural foraminal stenosis.  
C7-T1: Mild disc height loss. There is uncovertebral and facet hypertrophy with 
moderate right neural foraminal stenosis.  
-------------------------------------------------------------------------------- 
-----
IMPRESSION: 1.  Moderately advanced spondylotic changes cervical spine. 
2.  Demineralized disc protrusion C3-C4 with moderate canal stenosis. 
3.  Multilevel neural foraminal stenoses. 
4.  Cystic change within the dens and hemangiomas within C7 and T1 accounted for 
the prior marrow signal change on the MR exam of 01/01/2023. 
RADIATION DOSE REDUCTION: All CT scans are performed using radiation dose 
reduction techniques, when applicable.  Technical factors are evaluated and 
adjusted to ensure appropriate moderation of exposure.  Automated dose 
management technology is applied to adjust the radiation doses to minimize 
exposure while achieving diagnostic quality images.

## 2023-08-14 IMAGING — MG MAMMOGRAPHY SCREENING BILATERAL 3[PERSON_NAME]
8 series · 8 of 24 positions shown · non-contrast
Comparison: 06/15/2021 and exams dating back to 06/17/2013

________________________________________________________________________________________________ 
MAMMOGRAPHY SCREENING BILATERAL 3ANYI PLACIDE, 08/14/2023 [DATE]: 
CLINICAL INDICATION: Encounter for screening mammogram. History of left breast 
cancer with lumpectomy in 5449.
TECHNIQUE: Digital bilateral mammograms and 3-D Tomosynthesis were obtained. 
These were interpreted both primarily and with the aid of computer-aided 
detection system.  
BREAST DENSITY: (Level B) There are scattered areas of fibroglandular density.

[L MLO]
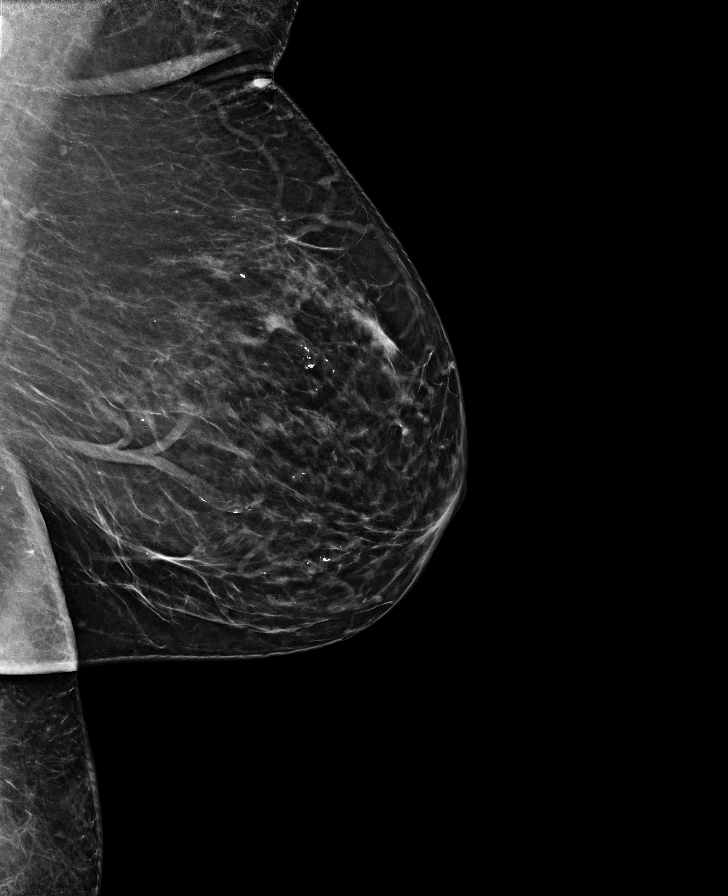

[L CC]
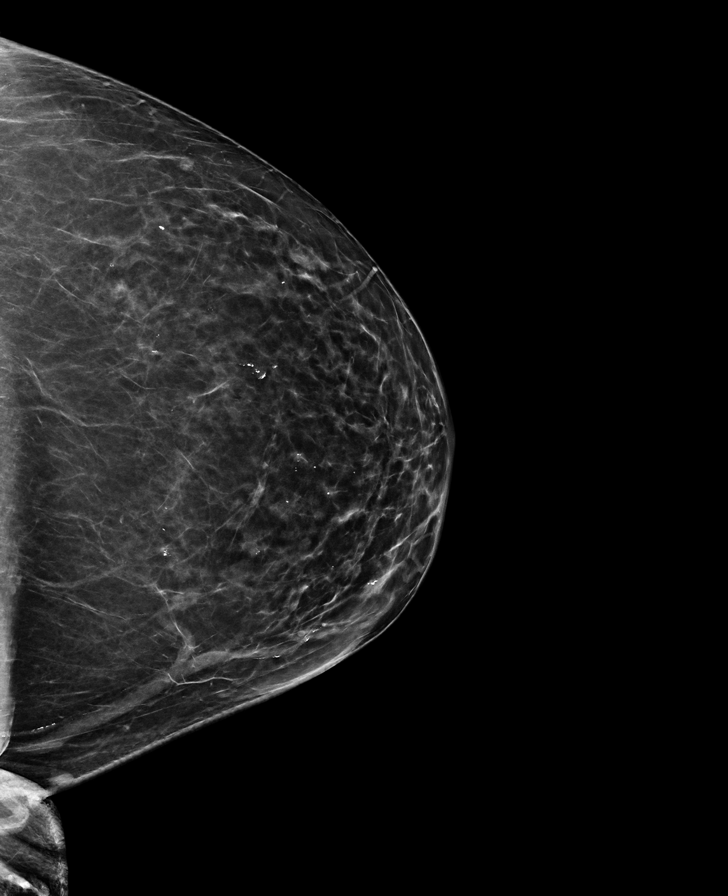

[R CC]
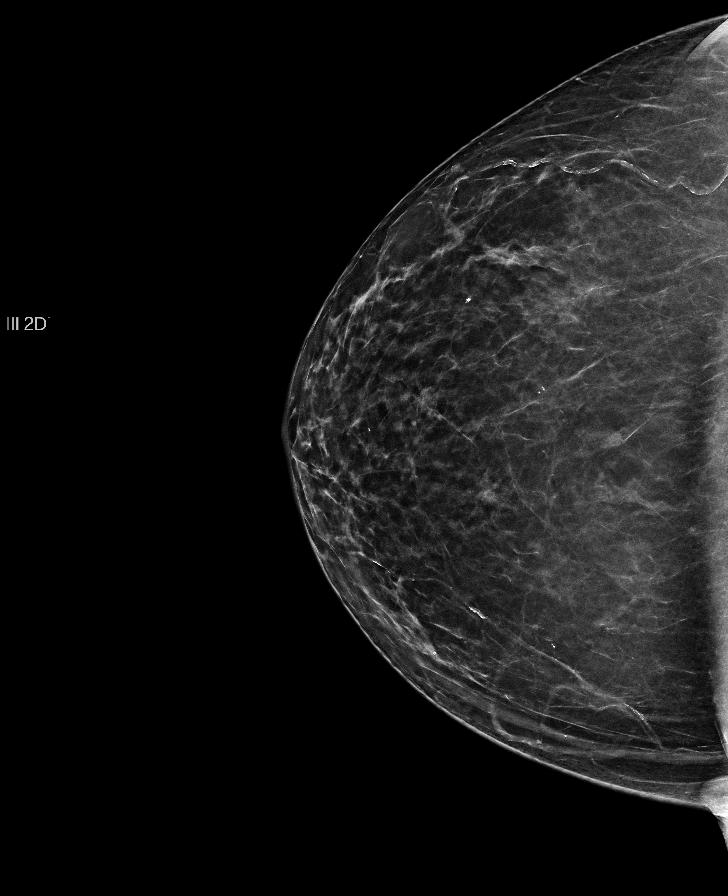

[R MLO]
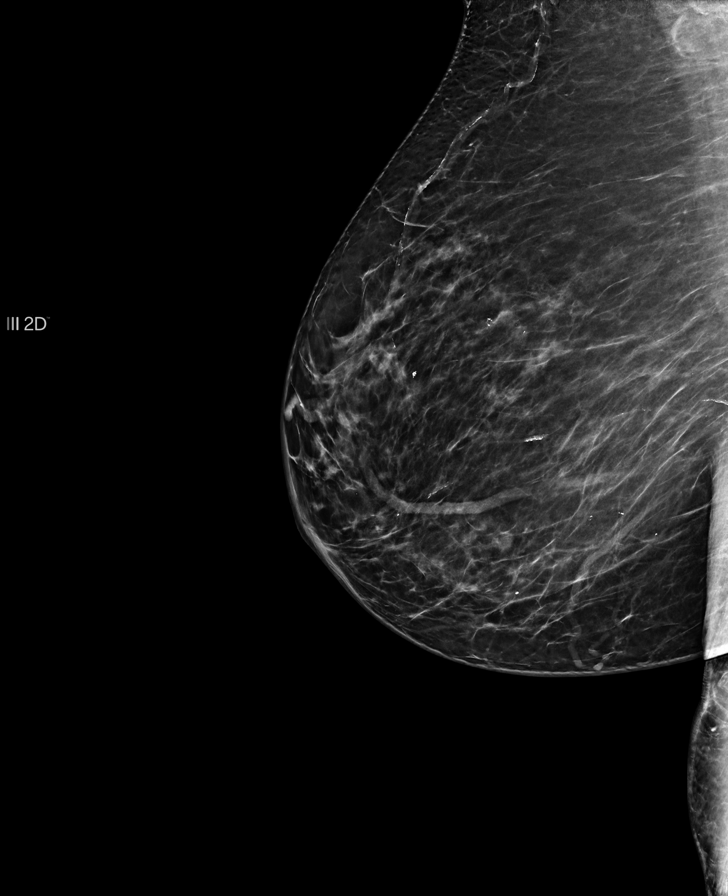

[L CC tomo · tomo slice 11/21.0]
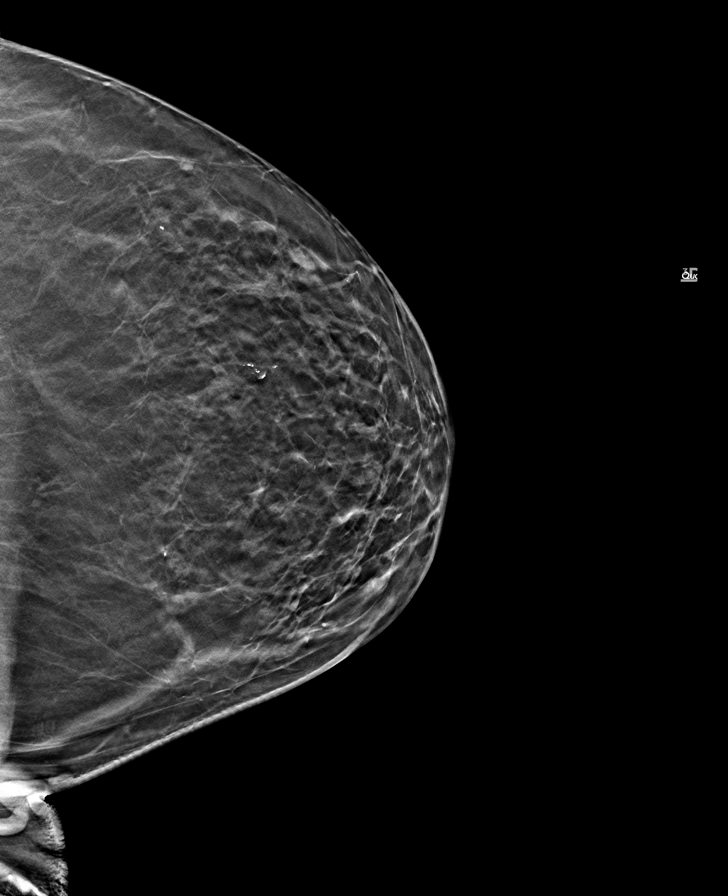

[R MLO tomo · tomo slice 12/23.0]
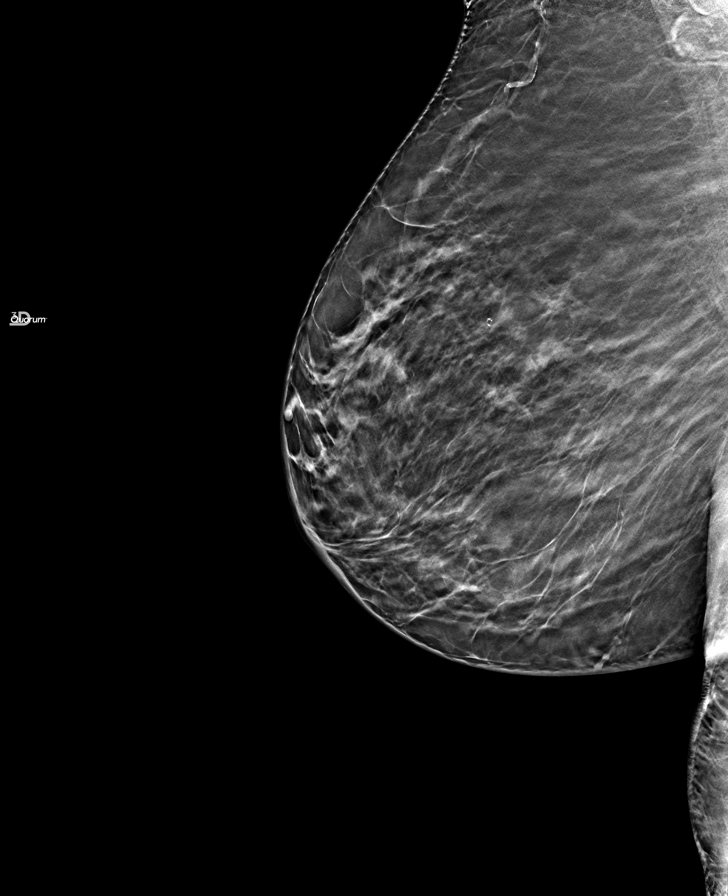

[L MLO tomo · tomo slice 11/22.0]
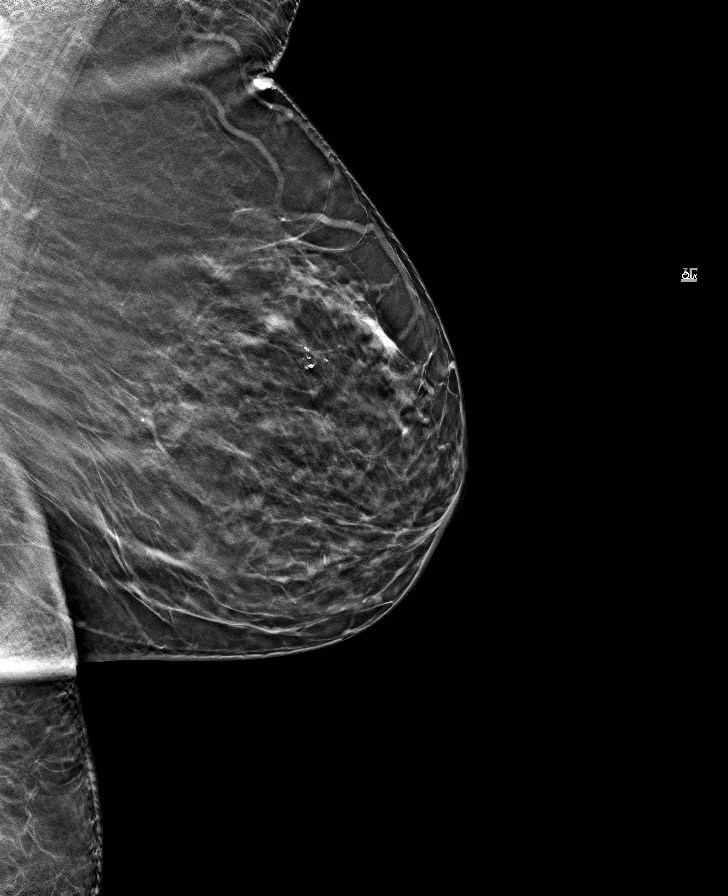

[R CC tomo · tomo slice 11/20.0]
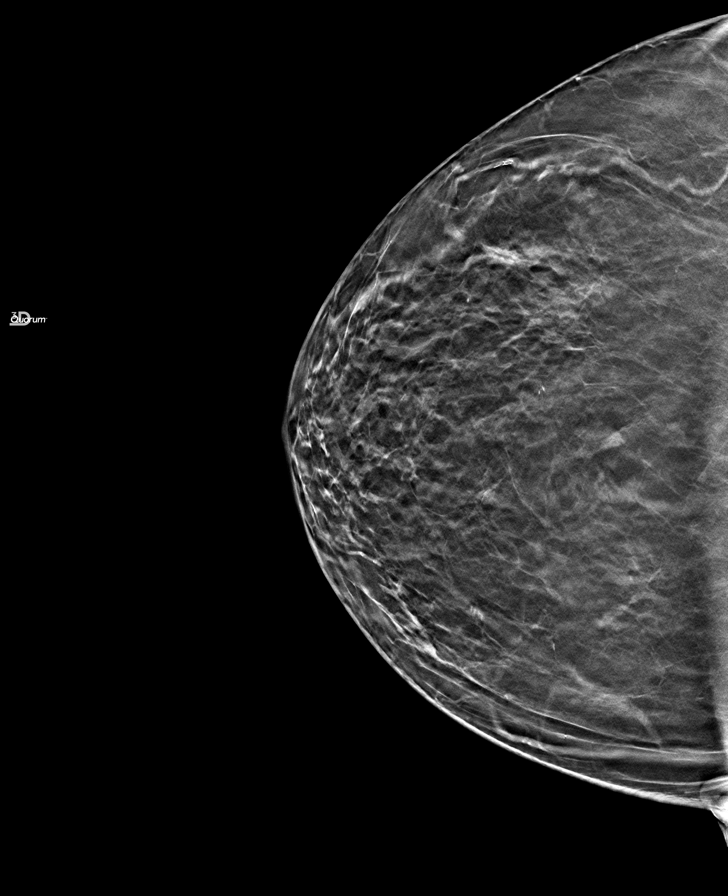

[8 of 24 positions shown; findings below may reference images not displayed]

FINDINGS: No suspicious mass, calcifications, or area of architectural 
distortion in the right breast. Posttreatment changes left breast. Overall 
stable mammographic appearance.
IMPRESSION: No mammographic findings suggestive for malignancy. 
(BI-RADS 2) Benign findings. Routine mammographic follow-up is recommended.

## 2023-12-08 IMAGING — DX HIP 2 VIEWS LEFT WITH PELVIS
3 series · 3 of 3 positions shown · non-contrast
Comparison: None

________________________________________________________________________________________________ 
HIP 2 VIEWS LEFT WITH PELVIS, 12/08/2023 [DATE]: 
CLINICAL INDICATION: Pain In Left Hip.

[AP (1 of 2)]
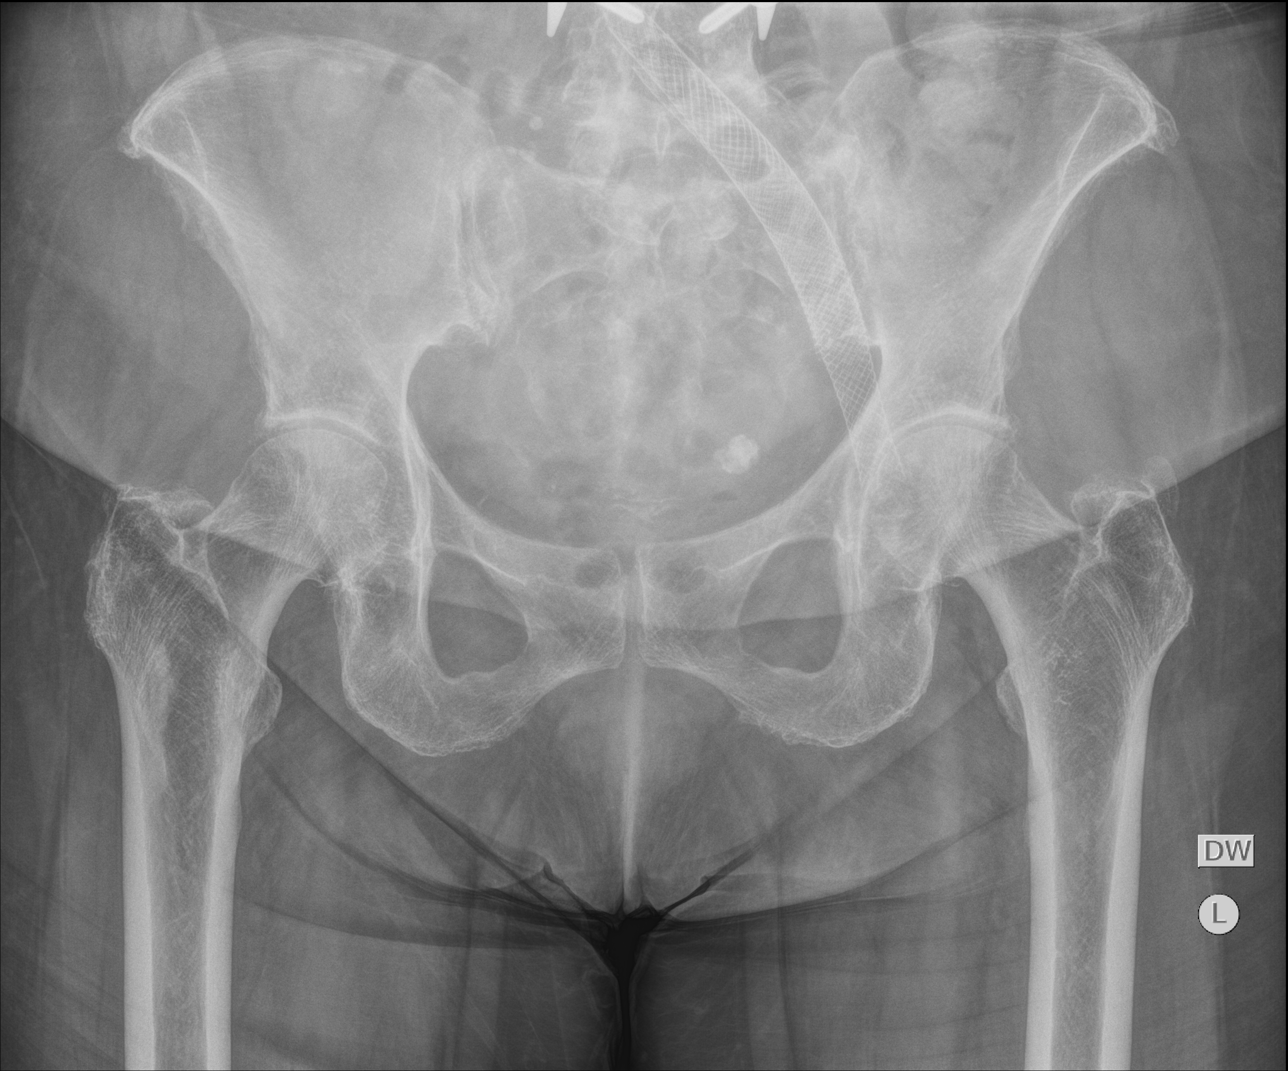

[AP (2 of 2)]
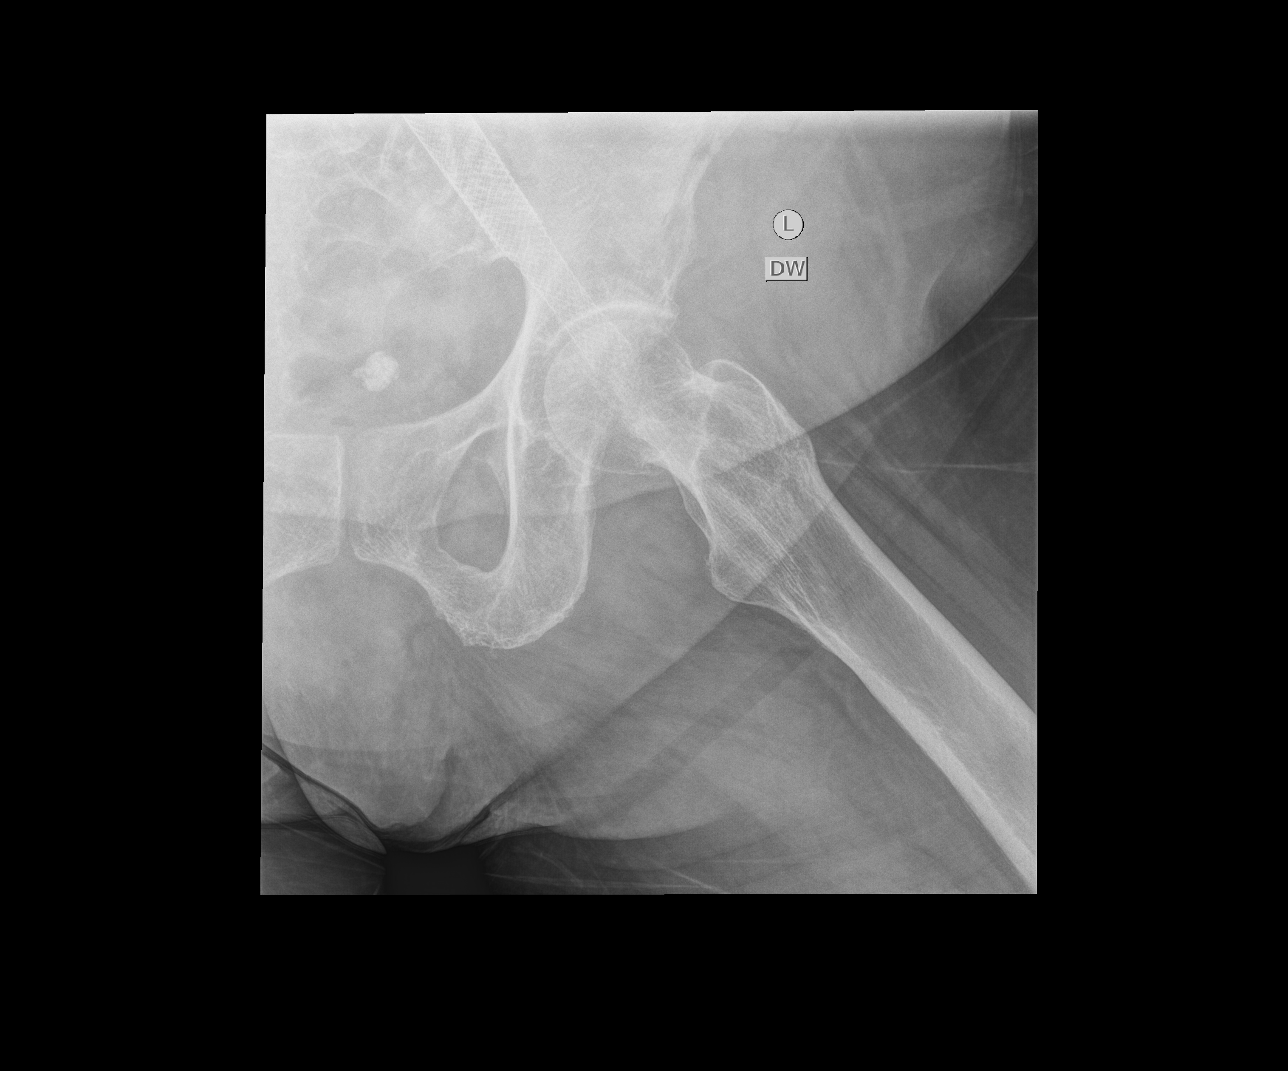

[lateral]
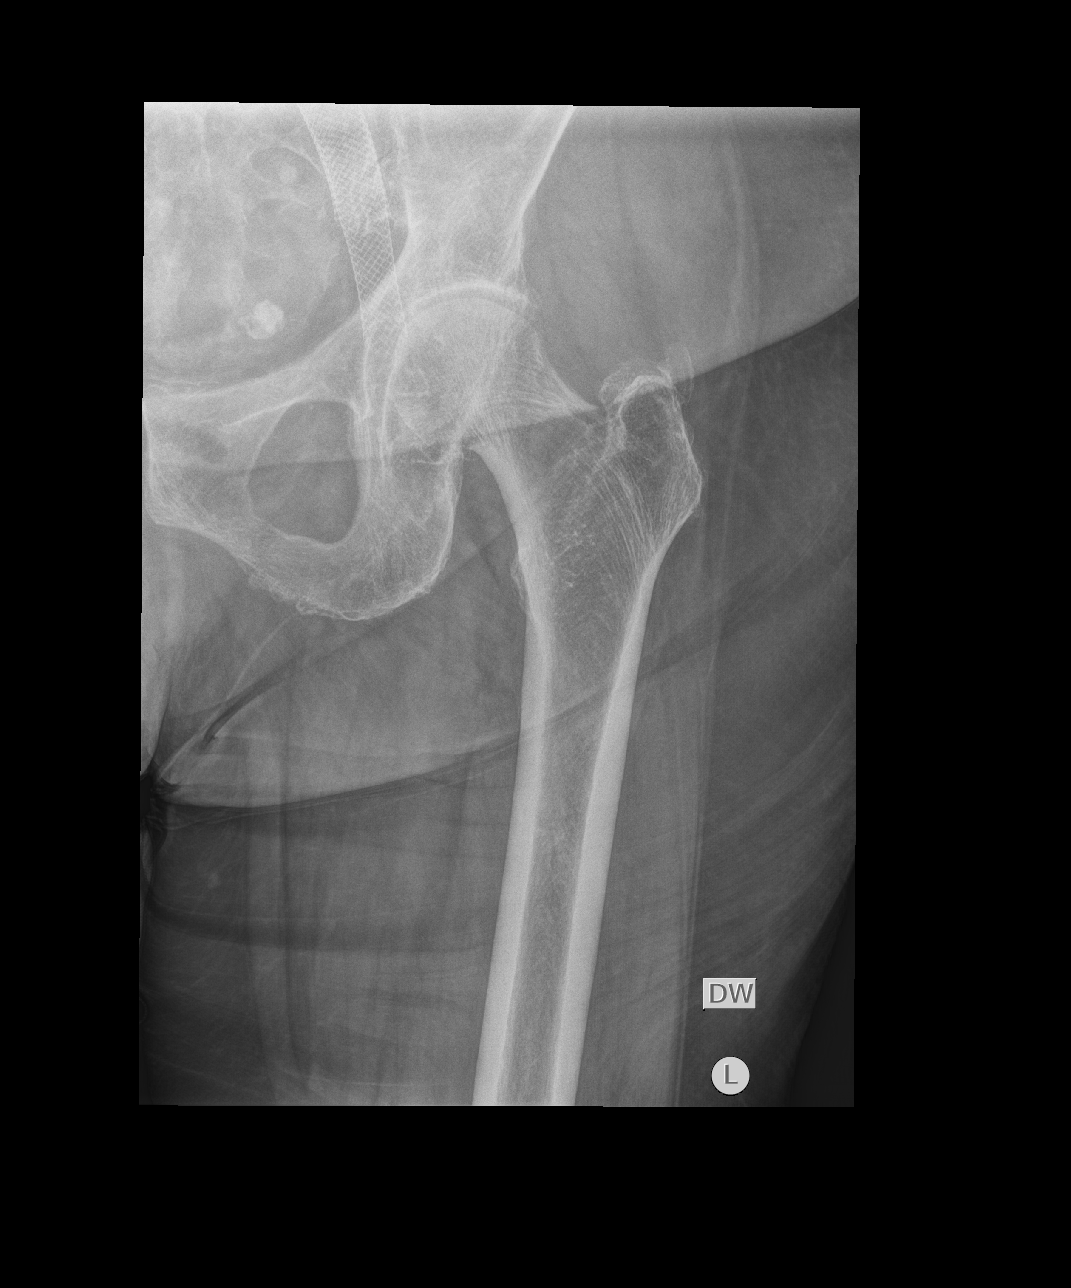

[3 of 3 positions shown; findings below may reference images not displayed]

FINDINGS: No fracture. Normal alignment. Hip joint spaces appear preserved. 
Postsurgical and degenerative changes included lower lumbar spine. Mild 
degenerative changes SI joints. Left iliac stent. Calcification within the 
pelvis may be uterine.
IMPRESSION: No acute osseous abnormality.
